# Patient Record
Sex: Female | Born: 1955 | Race: Black or African American | Hispanic: No | Marital: Married | Smoking: Never smoker
Health system: Southern US, Community
[De-identification: ages and names within clinical notes are randomized; demographics above are authoritative.]

## PROBLEM LIST (undated history)

## (undated) DIAGNOSIS — J45909 Unspecified asthma, uncomplicated: Secondary | ICD-10-CM

## (undated) DIAGNOSIS — R0600 Dyspnea, unspecified: Secondary | ICD-10-CM

## (undated) DIAGNOSIS — M419 Scoliosis, unspecified: Secondary | ICD-10-CM

## (undated) DIAGNOSIS — D860 Sarcoidosis of lung: Secondary | ICD-10-CM

## (undated) DIAGNOSIS — M5030 Other cervical disc degeneration, unspecified cervical region: Secondary | ICD-10-CM

## (undated) DIAGNOSIS — M5020 Other cervical disc displacement, unspecified cervical region: Secondary | ICD-10-CM

---

## 2013-06-15 ENCOUNTER — Ambulatory Visit: Payer: Self-pay | Admitting: Physician Assistant

## 2013-10-10 ENCOUNTER — Ambulatory Visit: Payer: Self-pay | Admitting: Physician Assistant

## 2014-06-06 ENCOUNTER — Emergency Department: Payer: Self-pay | Admitting: Internal Medicine

## 2016-10-24 IMAGING — CT CT HEAD WITHOUT CONTRAST
2 of 4 series · 11 of 37 positions shown, 13 images · non-contrast
Comparison: None.

CLINICAL DATA: Fall down steps this morning, left side head pain,
neck pain

EXAM:
CT HEAD WITHOUT CONTRAST
CT CERVICAL SPINE WITHOUT CONTRAST
TECHNIQUE: Multidetector CT imaging of the head and cervical spine was
performed following the standard protocol without intravenous
contrast. Multiplanar CT image reconstructions of the cervical spine
were also generated.

[Series 8: sag bone · sagittal · 0.19mm/px · 3 of 189 slices shown]
[im 63/189  brain]
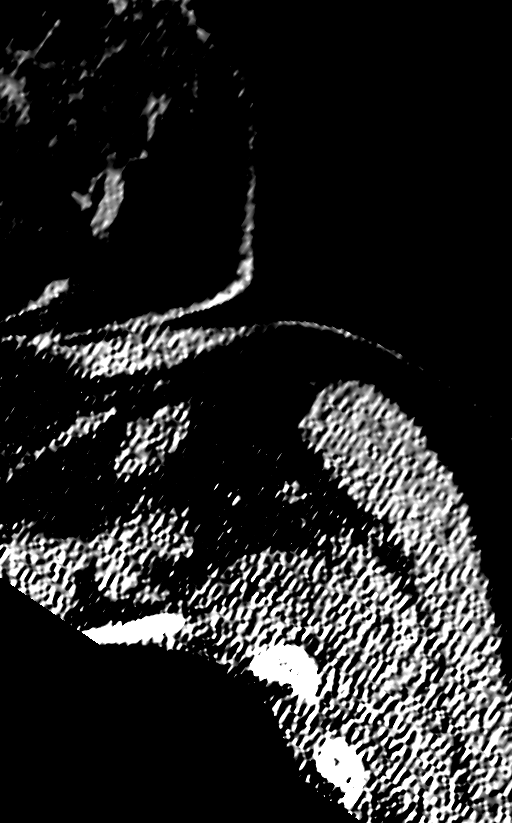
[im 95/189  brain]
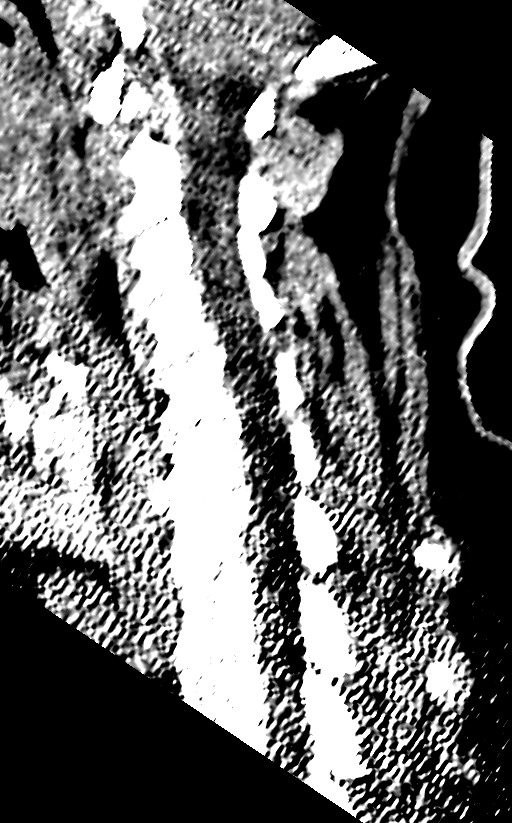
[im 126/189  brain]
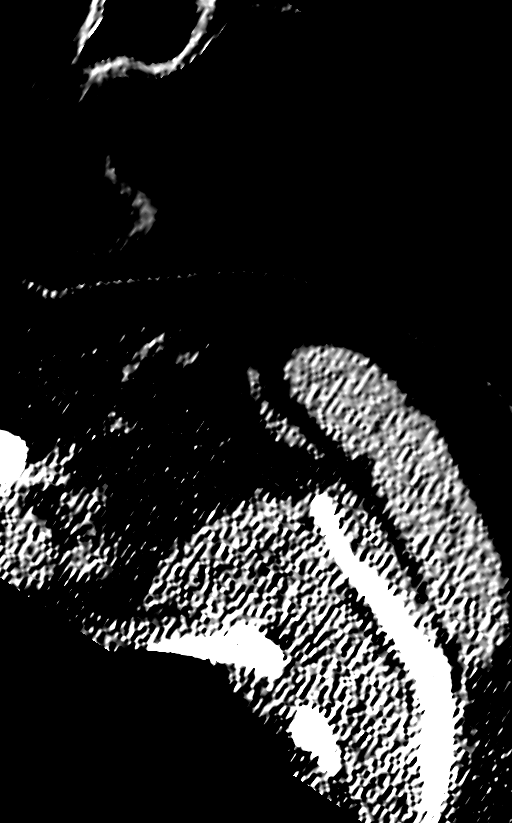

[Series 11: orthogonal axials · axial · 0.21mm/px · z∈[-310,-194]mm · 8 of 85 slices shown, 10 images]
[im 7/85  brain]
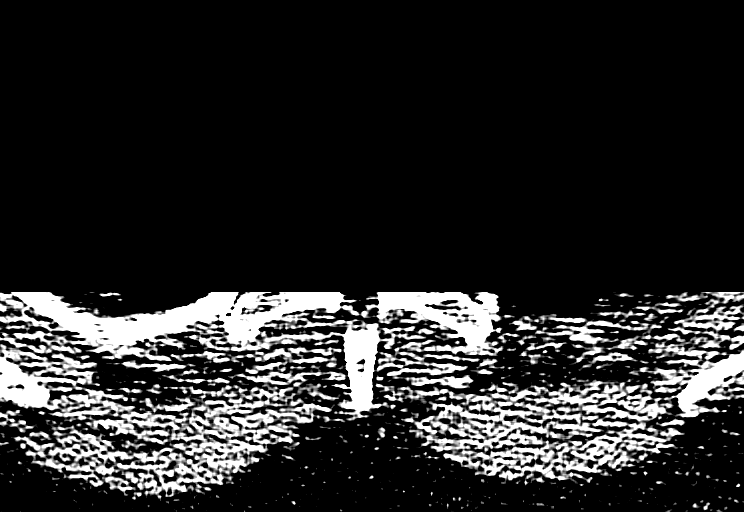
[im 7/85  bone]
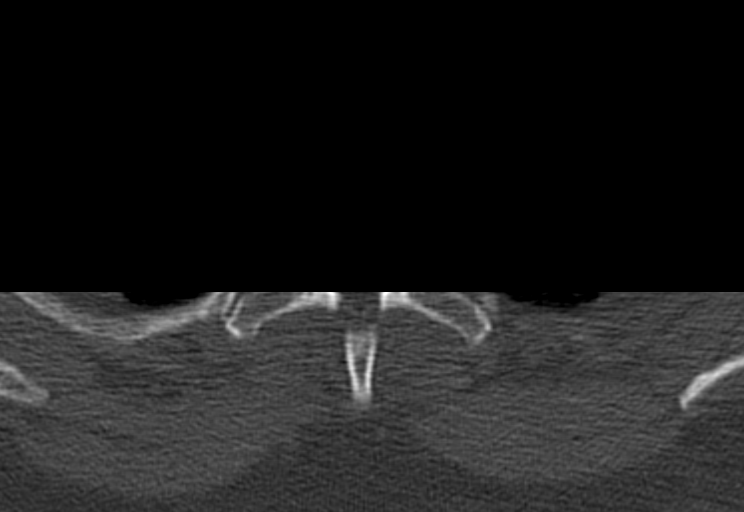
[im 20/85  brain]
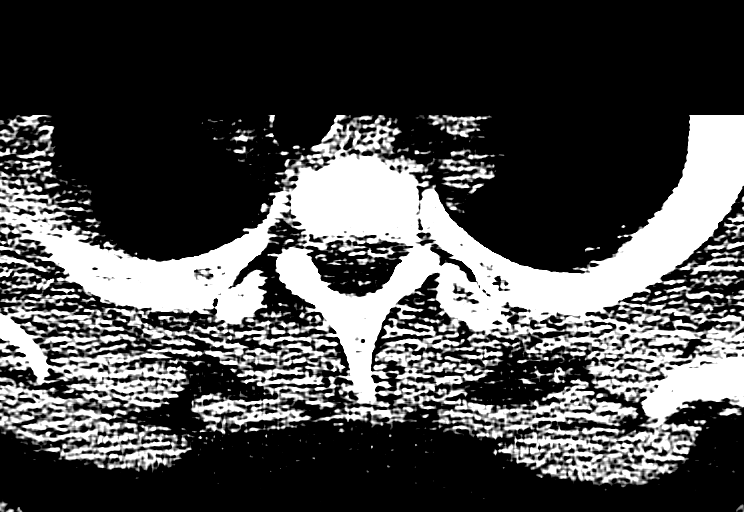
[im 26/85  brain]
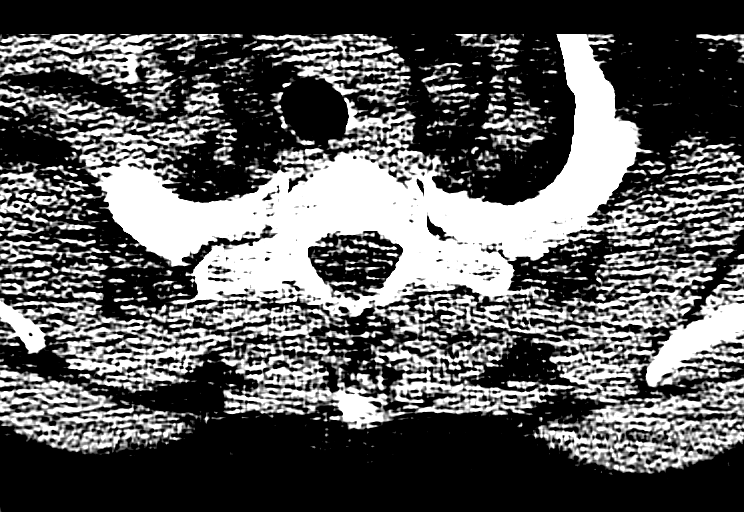
[im 39/85  brain]
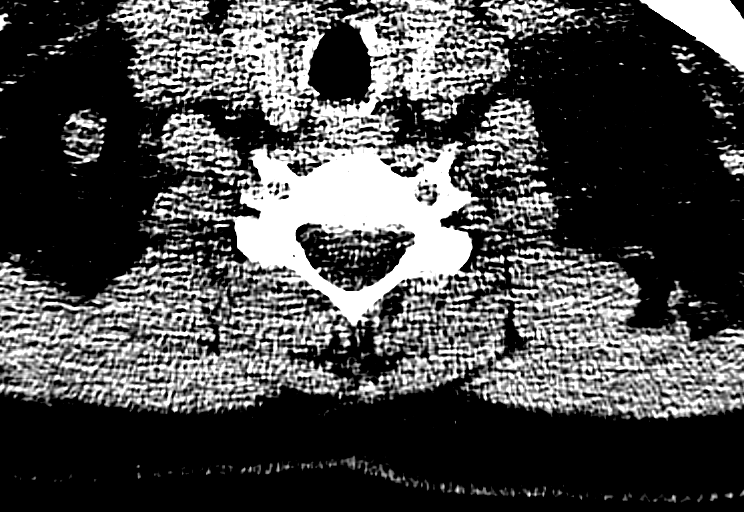
[im 46/85  brain]
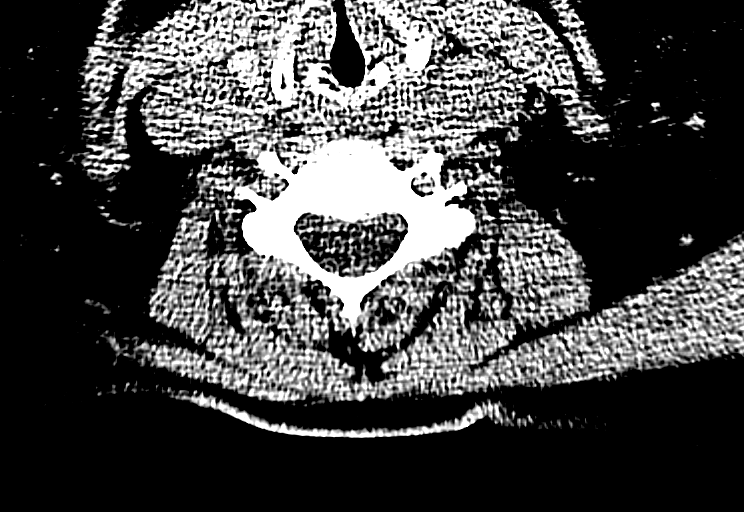
[im 46/85  bone]
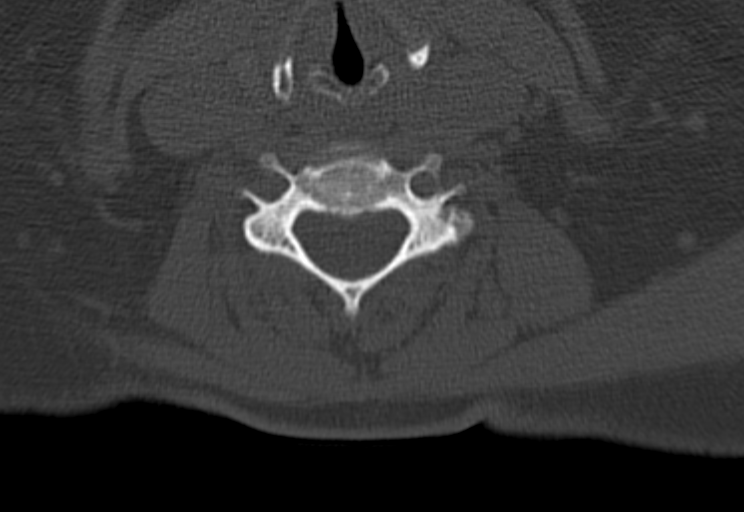
[im 59/85  brain]
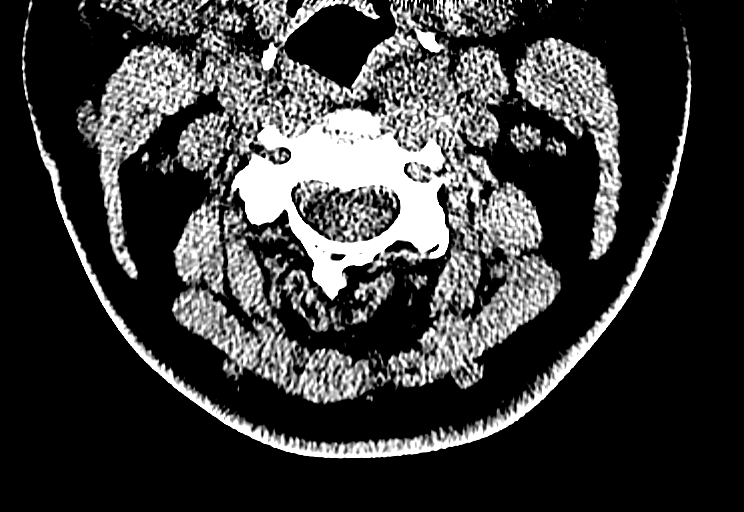
[im 65/85  brain]
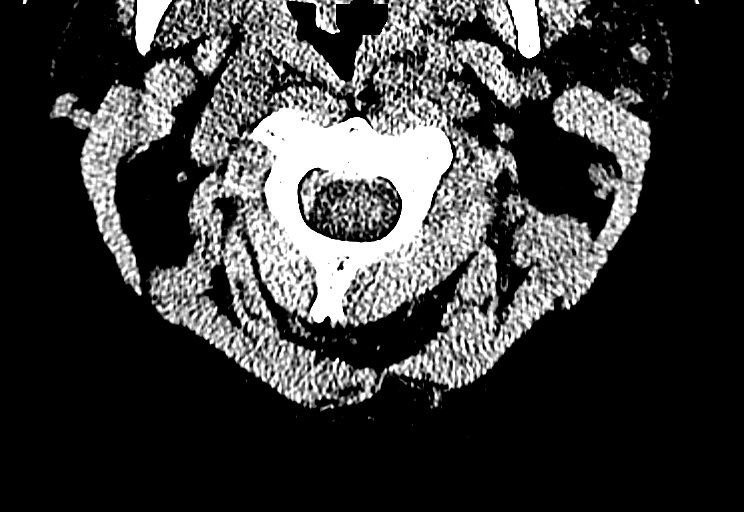
[im 78/85  brain]
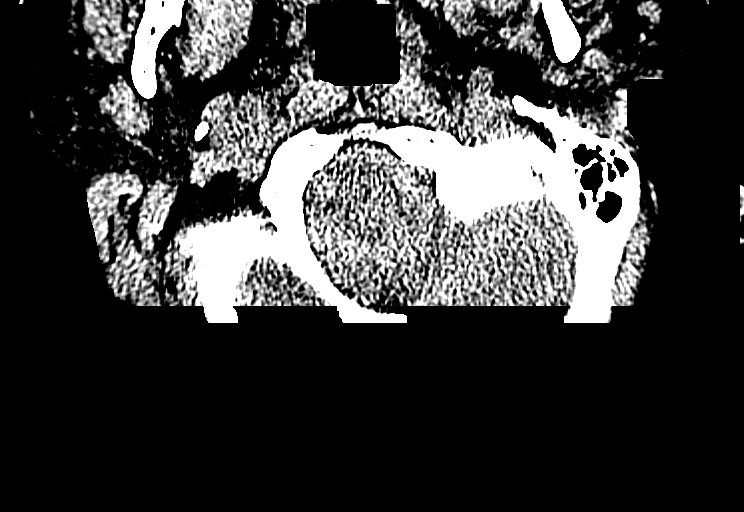

[11 of 37 positions shown; findings below may reference images not displayed]

FINDINGS: CT HEAD FINDINGS

No skull fracture is noted. Paranasal sinuses and mastoid air cells
are unremarkable.

No intracranial hemorrhage, mass effect or midline shift.

No acute cortical infarction. No mass lesion is noted on this
unenhanced scan. Bilateral basal ganglia calcifications are noted.

No hydrocephalus.  No intra or extra-axial fluid collection.

CT CERVICAL SPINE FINDINGS

Axial images of the cervical spine shows no acute fracture or
subluxation. Mild degenerative changes C1-C2 articulation. Mild
reversal of cervical lordosis. Mild anterior spurring at lower
endplate of C5 and C6 vertebral body. Minimal disc space flattening
at C5-C6 level. Mild disc space flattening with anterior spurring at
C6-C7 level. No prevertebral soft tissue swelling. Cervical airway
is patent.

There is no pneumothorax in visualized lung apices.
IMPRESSION: 1. No acute intracranial abnormality.
2. No cervical spine acute fracture or subluxation. Mild
degenerative changes as described above.

## 2017-07-23 NOTE — H&P (Signed)
  Chief Complaint:     [name] is a 62 y.o. female here for scheduled surgery.   HPI:  Pt presents for a preoperative visit to schedule a D&C, hysteroscopy, and polypectomy.  She has a hx of: postmenopausal bleeding and cervical polyp  Workup has included:  EMBx: disordered proliferative phase endometrium  Polypectomy in office:  Part A: CERVIX, LEFT SIDE, POLYP:  BENIGN ENDOCERVICAL POLYP.  Part B: CERVIX, RIGHT SIDE, POLYP:  BENIGN ENDOMETRIAL POLYP.    Past Medical History:  has no past medical history on file.  Past Surgical History:  has no past surgical history on file. Family History: family history is not on file. Social History:   OB/GYN History:  OB History   None     Allergies: has no allergies on file. Medications: No current facility-administered medications for this encounter.  No current outpatient medications on file.  Review of Systems: No SOB, no palpitations or chest pain, no new lower extremity edema, no nausea or vomiting or bowel or bladder complaints. See HPI for gyn specific ROS.   Exam:   BP: 140/89   Body mass index is 35.81 kg/m.  General: Patient is well-groomed, well-nourished, appears stated age in no acute distress   HEENT: head is atraumatic and normocephalic, trachea is midline, neck is supple with no palpable nodules   CV: Regular rhythm and normal heart rate, no murmur   Pulm: Clear to auscultation throughout lung fields with no wheezing, crackles, or rhonchi. No increased work of breathing  Abdomen: soft , no mass, non-tender, no rebound tenderness, no hepatomegaly  Pelvic: Deferred  Impression:   There were no encounter diagnoses.    Plan:   -  Preoperative visit: D&C hysteroscopy, polypectomy. Consents signed today. Risks of surgery were discussed with the patient including but not limited to: bleeding which may require transfusion; infection which may require antibiotics; injury to uterus or surrounding  organs; intrauterine scarring which may impair future fertility; need for additional procedures including laparotomy or laparoscopy; and other postoperative/anesthesia complications. Written informed consent was obtained.  This is a scheduled same-day surgery. She will have a postop visit in 2 weeks to review operative findings and pathology.  Christeen DouglasBethany Arletha Marschke, MD

## 2017-08-04 ENCOUNTER — Other Ambulatory Visit: Payer: Self-pay

## 2017-08-04 ENCOUNTER — Encounter
Admission: RE | Admit: 2017-08-04 | Discharge: 2017-08-04 | Disposition: A | Discharge status: Home / Self Care | Payer: BLUE CROSS/BLUE SHIELD | Source: Ambulatory Visit | Attending: Obstetrics and Gynecology | Admitting: Obstetrics and Gynecology

## 2017-08-04 DIAGNOSIS — Z01812 Encounter for preprocedural laboratory examination: Secondary | ICD-10-CM | POA: Diagnosis not present

## 2017-08-04 HISTORY — DX: Other cervical disc displacement, unspecified cervical region: M50.20

## 2017-08-04 HISTORY — DX: Other cervical disc degeneration, unspecified cervical region: M50.30

## 2017-08-04 HISTORY — DX: Unspecified asthma, uncomplicated: J45.909

## 2017-08-04 HISTORY — DX: Sarcoidosis of lung: D86.0

## 2017-08-04 HISTORY — DX: Scoliosis, unspecified: M41.9

## 2017-08-04 HISTORY — DX: Dyspnea, unspecified: R06.00

## 2017-08-04 LAB — BASIC METABOLIC PANEL
Anion gap: 9 (ref 5–15)
BUN: 12 mg/dL (ref 6–20)
CHLORIDE: 103 mmol/L (ref 101–111)
CO2: 25 mmol/L (ref 22–32)
Calcium: 8.7 mg/dL — ABNORMAL LOW (ref 8.9–10.3)
Creatinine, Ser: 0.74 mg/dL (ref 0.44–1.00)
GFR calc Af Amer: >60 mL/min (ref >60)
GFR calc non Af Amer: >60 mL/min (ref >60)
Glucose, Bld: 86 mg/dL (ref 65–99)
POTASSIUM: 4 mmol/L (ref 3.5–5.1)
Sodium: 137 mmol/L (ref 135–145)

## 2017-08-04 LAB — TYPE AND SCREEN
ABO/RH(D): B POS
Antibody Screen: NEGATIVE

## 2017-08-04 LAB — CBC
HEMATOCRIT: 40.7 % (ref 35.0–47.0)
Hemoglobin: 12.8 g/dL (ref 12.0–16.0)
MCH: 24.7 pg — ABNORMAL LOW (ref 26.0–34.0)
MCHC: 31.5 g/dL — AB (ref 32.0–36.0)
MCV: 78.5 fL — AB (ref 80.0–100.0)
Platelets: 285 10*3/uL (ref 150–440)
RBC: 5.19 MIL/uL (ref 3.80–5.20)
RDW: 15.8 % — AB (ref 11.5–14.5)
WBC: 12.6 10*3/uL — AB (ref 3.6–11.0)

## 2017-08-04 NOTE — Patient Instructions (Signed)
Your procedure is scheduled on: Monday 08/10/17 Report to [place]. To find out your arrival time please call [phone] between 1PM - 3PM on Friday 08/07/17.  Remember: Instructions that are not followed completely may result in serious medical risk, up to and including death, or upon the discretion of your surgeon and anesthesiologist your surgery may need to be rescheduled.     _X__ 1. Do not eat food after midnight the night before your procedure.                 No gum chewing or hard candies. You may drink clear liquids up to 2 hours                 before you are scheduled to arrive for your surgery- DO not drink clear                 liquids within 2 hours of the start of your surgery.                 Clear Liquids include:  water, apple juice without pulp, clear carbohydrate                 drink such as Clearfast or Gatorade, Black Coffee or Tea (Do not add                 anything to coffee or tea).  __X__2.  On the morning of surgery brush your teeth with toothpaste and water, you                 may rinse your mouth with mouthwash if you wish.  Do not swallow any              toothpaste of mouthwash.     _X__ 3.  No Alcohol for 24 hours before or after surgery.   _X__ 4.  Do Not Smoke or use e-cigarettes For 24 Hours Prior to Your Surgery.                 Do not use any chewable tobacco products for at least 6 hours prior to                 surgery.  ____  5.  Bring all medications with you on the day of surgery if instructed.   __X__  6.  Notify your doctor if there is any change in your medical condition      (cold, fever, infections).     Do not wear jewelry, make-up, hairpins, clips or nail polish. Do not wear lotions, powders, or perfumes.  Do not shave 48 hours prior to surgery. Men may shave face and neck. Do not bring valuables to the hospital.    [facility] is not responsible for any belongings or  valuables.  Contacts, dentures/partials or body piercings may not be worn into surgery. Bring a case for your contacts, glasses or hearing aids, a denture cup will be supplied. Leave your suitcase in the car. After surgery it may be brought to your room. For patients admitted to the hospital, discharge time is determined by your treatment team.   Patients discharged the day of surgery will not be allowed to drive home.   Please read over the following fact sheets that you were given:   MRSA Information  __X__ Take these medicines the morning of surgery with A SIP OF WATER:  1. Prednisone of no completed  2.   3.   4.  5.  6.  ____ Fleet Enema (as directed)   __X__ Use CHG Soap/SAGE wipes as directed  __x__ Use inhalers on the day of surgery and nebulizer prior to arrival  ____ Stop metformin/Janumet/Farxiga 2 days prior to surgery    ____ Take 1/2 of usual insulin dose the night before surgery. No insulin the morning          of surgery.   ____ Stop Blood Thinners Coumadin/Plavix/Xarelto/Pleta/Pradaxa/Eliquis/Effient/Aspirin  on   Or contact your Surgeon, Cardiologist or Medical Doctor regarding  ability to stop your blood thinners  __X__ Stop Anti-inflammatories 7 days before surgery such as Advil, Ibuprofen, Motrin,  BC or Goodies Powder, Naprosyn, Naproxen, Aleve, Aspirin    __X__ Stop all herbal supplements, fish oil or vitamin E until after surgery.    ____ Bring C-Pap to the hospital.

## 2017-08-05 ENCOUNTER — Inpatient Hospital Stay: Admission: RE | Admit: 2017-08-05 | Payer: Self-pay | Source: Ambulatory Visit

## 2017-08-10 ENCOUNTER — Other Ambulatory Visit: Payer: Self-pay

## 2017-08-10 ENCOUNTER — Ambulatory Visit: Payer: BLUE CROSS/BLUE SHIELD | Admitting: Anesthesiology

## 2017-08-10 ENCOUNTER — Encounter
Admission: RE | Disposition: A | Discharge status: Home / Self Care | Payer: Self-pay | Source: Ambulatory Visit | Attending: Obstetrics and Gynecology

## 2017-08-10 ENCOUNTER — Ambulatory Visit
Admission: RE | Admit: 2017-08-10 | Discharge: 2017-08-10 | Disposition: A | Discharge status: Home / Self Care | Payer: BLUE CROSS/BLUE SHIELD | Source: Ambulatory Visit | Attending: Obstetrics and Gynecology | Admitting: Obstetrics and Gynecology

## 2017-08-10 ENCOUNTER — Encounter: Payer: Self-pay | Admitting: *Deleted

## 2017-08-10 DIAGNOSIS — J45909 Unspecified asthma, uncomplicated: Secondary | ICD-10-CM | POA: Insufficient documentation

## 2017-08-10 DIAGNOSIS — N950 Postmenopausal bleeding: Secondary | ICD-10-CM | POA: Insufficient documentation

## 2017-08-10 DIAGNOSIS — N840 Polyp of corpus uteri: Secondary | ICD-10-CM | POA: Diagnosis not present

## 2017-08-10 DIAGNOSIS — M419 Scoliosis, unspecified: Secondary | ICD-10-CM | POA: Diagnosis not present

## 2017-08-10 DIAGNOSIS — D860 Sarcoidosis of lung: Secondary | ICD-10-CM | POA: Diagnosis not present

## 2017-08-10 HISTORY — PX: HYSTEROSCOPY WITH D & C: SHX1775

## 2017-08-10 LAB — ABO/RH: ABO/RH(D): B POS

## 2017-08-10 SURGERY — DILATATION AND CURETTAGE /HYSTEROSCOPY
Anesthesia: General | Wound class: Clean Contaminated

## 2017-08-10 MED ORDER — LACTATED RINGERS IV SOLN
INTRAVENOUS | Status: DC
  Administered 2017-08-10: 13:00:00 via INTRAVENOUS

## 2017-08-10 MED ORDER — IPRATROPIUM-ALBUTEROL 0.5-2.5 (3) MG/3ML IN SOLN: 3.0000 mL | Freq: Four times a day (QID) | RESPIRATORY_TRACT | Status: DC

## 2017-08-10 MED ORDER — FENTANYL CITRATE (PF) 100 MCG/2ML IJ SOLN: 25.0000 ug | INTRAMUSCULAR | Status: DC | PRN

## 2017-08-10 MED ORDER — FAMOTIDINE 20 MG PO TABS
20.0000 mg | ORAL_TABLET | Freq: Once | ORAL | Status: AC
  Administered 2017-08-10: 20 mg via ORAL

## 2017-08-10 MED ORDER — MEPERIDINE HCL 50 MG/ML IJ SOLN: 6.2500 mg | INTRAMUSCULAR | Status: DC | PRN

## 2017-08-10 MED ORDER — FAMOTIDINE 20 MG PO TABS
ORAL_TABLET | ORAL | Status: AC
  Administered 2017-08-10: 20 mg via ORAL
  Filled 2017-08-10: qty 1

## 2017-08-10 MED ORDER — HYDROCODONE-ACETAMINOPHEN 7.5-325 MG PO TABS
1.0000 | ORAL_TABLET | Freq: Once | ORAL | Status: DC | PRN
  Filled 2017-08-10: qty 1

## 2017-08-10 MED ORDER — PROPOFOL 10 MG/ML IV BOLUS
INTRAVENOUS | Status: AC
  Filled 2017-08-10: qty 40

## 2017-08-10 MED ORDER — PHENYLEPHRINE HCL 10 MG/ML IJ SOLN
INTRAMUSCULAR | Status: DC | PRN
  Administered 2017-08-10: 100 ug via INTRAVENOUS

## 2017-08-10 MED ORDER — PROPOFOL 10 MG/ML IV BOLUS
INTRAVENOUS | Status: DC | PRN
  Administered 2017-08-10: 200 mg via INTRAVENOUS

## 2017-08-10 MED ORDER — FENTANYL CITRATE (PF) 100 MCG/2ML IJ SOLN
INTRAMUSCULAR | Status: AC
  Filled 2017-08-10: qty 2

## 2017-08-10 MED ORDER — LIDOCAINE HCL (PF) 2 % IJ SOLN
INTRAMUSCULAR | Status: AC
  Filled 2017-08-10: qty 10

## 2017-08-10 MED ORDER — MIDAZOLAM HCL 2 MG/2ML IJ SOLN
INTRAMUSCULAR | Status: DC | PRN
  Administered 2017-08-10: 2 mg via INTRAVENOUS

## 2017-08-10 MED ORDER — GLYCOPYRROLATE 0.2 MG/ML IJ SOLN
INTRAMUSCULAR | Status: AC
  Filled 2017-08-10: qty 1

## 2017-08-10 MED ORDER — ONDANSETRON HCL 4 MG/2ML IJ SOLN
INTRAMUSCULAR | Status: AC
  Filled 2017-08-10: qty 2

## 2017-08-10 MED ORDER — IPRATROPIUM-ALBUTEROL 0.5-2.5 (3) MG/3ML IN SOLN
3.0000 mL | Freq: Once | RESPIRATORY_TRACT | Status: AC
  Administered 2017-08-10: 3 mL via RESPIRATORY_TRACT

## 2017-08-10 MED ORDER — MIDAZOLAM HCL 2 MG/2ML IJ SOLN
INTRAMUSCULAR | Status: AC
  Filled 2017-08-10: qty 2

## 2017-08-10 MED ORDER — IPRATROPIUM-ALBUTEROL 0.5-2.5 (3) MG/3ML IN SOLN
RESPIRATORY_TRACT | Status: AC
  Administered 2017-08-10: 3 mL via RESPIRATORY_TRACT
  Filled 2017-08-10: qty 3

## 2017-08-10 MED ORDER — ONDANSETRON HCL 4 MG/2ML IJ SOLN
INTRAMUSCULAR | Status: DC | PRN
  Administered 2017-08-10: 4 mg via INTRAVENOUS

## 2017-08-10 MED ORDER — GLYCOPYRROLATE 0.2 MG/ML IJ SOLN
INTRAMUSCULAR | Status: DC | PRN
  Administered 2017-08-10: 0.2 mg via INTRAVENOUS

## 2017-08-10 MED ORDER — PROMETHAZINE HCL 25 MG/ML IJ SOLN: 6.2500 mg | INTRAMUSCULAR | Status: DC | PRN

## 2017-08-10 MED ORDER — FENTANYL CITRATE (PF) 100 MCG/2ML IJ SOLN
INTRAMUSCULAR | Status: DC | PRN
  Administered 2017-08-10 (×4): 25 ug via INTRAVENOUS

## 2017-08-10 MED ORDER — LIDOCAINE HCL (CARDIAC) PF 100 MG/5ML IV SOSY
PREFILLED_SYRINGE | INTRAVENOUS | Status: DC | PRN
  Administered 2017-08-10: 60 mg via INTRAVENOUS

## 2017-08-10 SURGICAL SUPPLY — 17 items
BAG INFUSER PRESSURE 100CC (MISCELLANEOUS) ×3 IMPLANT
CANISTER SUCT 3000ML PPV (MISCELLANEOUS) ×3 IMPLANT
CATH ROBINSON RED A/P 16FR (CATHETERS) ×3 IMPLANT
ELECT REM PT RETURN 9FT ADLT (ELECTROSURGICAL) ×3
ELECTRODE REM PT RTRN 9FT ADLT (ELECTROSURGICAL) ×1 IMPLANT
GLOVE BIO SURGEON STRL SZ7 (GLOVE) ×3 IMPLANT
GLOVE INDICATOR 7.5 STRL GRN (GLOVE) ×3 IMPLANT
GOWN STRL REUS W/ TWL LRG LVL3 (GOWN DISPOSABLE) ×2 IMPLANT
GOWN STRL REUS W/TWL LRG LVL3 (GOWN DISPOSABLE) ×4
IV LACTATED RINGERS 1000ML (IV SOLUTION) ×3 IMPLANT
KIT TURNOVER CYSTO (KITS) ×3 IMPLANT
PACK DNC HYST (MISCELLANEOUS) ×3 IMPLANT
PAD OB MATERNITY 4.3X12.25 (PERSONAL CARE ITEMS) ×3 IMPLANT
PAD PREP 24X41 OB/GYN DISP (PERSONAL CARE ITEMS) ×3 IMPLANT
TUBING CONNECTING 10 (TUBING) ×2 IMPLANT
TUBING CONNECTING 10' (TUBING) ×1
TUBING HYSTEROSCOPY DOLPHIN (MISCELLANEOUS) ×3 IMPLANT

## 2017-08-10 NOTE — Interval H&P Note (Signed)
History and Physical Interval Note:  08/10/2017 1:11 PM  Victoria Leon  has presented today for surgery, with the diagnosis of postmenopausal bleeding  The various methods of treatment have been discussed with the patient and family. After consideration of risks, benefits and other options for treatment, the patient has consented to  Procedure(s): DILATATION AND CURETTAGE /HYSTEROSCOPY (N/A) as a surgical intervention .  The patient's history has been reviewed, patient examined, no change in status, stable for surgery.  I have reviewed the patient's chart and labs.  Questions were answered to the patient's satisfaction.     Christeen Douglas

## 2017-08-10 NOTE — Anesthesia Preprocedure Evaluation (Addendum)
Anesthesia Evaluation  Patient identified by MRN, date of birth, ID band Patient awake    Reviewed: Allergy & Precautions, H&P , NPO status , reviewed documented beta blocker date and time   Airway Mallampati: III  TM Distance: >3 FB Neck ROM: full    Dental  (+) Upper Dentures, Chipped   Pulmonary shortness of breath, asthma ,   Limited chest excursion Pulmonary exam normal breath sounds clear to auscultation + decreased breath sounds      Cardiovascular      Neuro/Psych    GI/Hepatic   Endo/Other    Renal/GU      Musculoskeletal  (+) Arthritis ,   Abdominal   Peds  Hematology   Anesthesia Other Findings Past Medical History: No date: Asthma No date: Bulging of cervical intervertebral disc No date: Dyspnea No date: Sarcoidosis of lung (HCC) No date: Scoliosis  Past Surgical History: No date: CESAREAN SECTION  BMI    Body Mass Index:  36.84 kg/m      Reproductive/Obstetrics                             Anesthesia Physical Anesthesia Plan  ASA: III  Anesthesia Plan: General   Post-op Pain Management:    Induction:   PONV Risk Score and Plan: 4 or greater and Ondansetron, Midazolam and Metaclopromide  Airway Management Planned:   Additional Equipment:   Intra-op Plan:   Post-operative Plan:   Informed Consent: I have reviewed the patients History and Physical, chart, labs and discussed the procedure including the risks, benefits and alternatives for the proposed anesthesia with the patient or authorized representative who has indicated his/her understanding and acceptance.   Dental Advisory Given  Plan Discussed with: CRNA  Anesthesia Plan Comments:         Anesthesia Quick Evaluation

## 2017-08-10 NOTE — Discharge Instructions (Addendum)
Discharge instructions after a hysteroscopy with dilation and curettage ? ?Signs and Symptoms to Report ? ?Call our office at [phone] if you have any of the following:  ? ? Fever over 100.4 degrees or higher ? Severe stomach pain not relieved with pain medications ? Bright red bleeding that?s heavier than a period that does not slow with rest after the first 24 hours ? To go the bathroom a lot (frequency), you can?t hold your urine (urgency), or it hurts when you empty your bladder (urinate) ? Chest pain ? Shortness of breath ? Pain in the calves of your legs ? Severe nausea and vomiting not relieved with anti-nausea medications ? Any concerns ? ?What You Can Expect after Surgery ? You may see some pink tinged, bloody fluid. This is normal. You may also have cramping for several days.  ? ?Activities after Your Discharge ?Follow these guidelines to help speed your recovery at home: ? Don?t drive if you are in pain or taking narcotic pain medicine. You may drive when you can safely slam on the brakes, turn the wheel forcefully, and rotate your torso comfortably. This is typically 4-7 days. Practice in a parking lot or side street prior to attempting to drive regularly.  ? Ask others to help with household chores for 4 weeks. ? Don?t do strenuous activities, exercises, or sports like vacuuming, tennis, squash, etc. until your doctor says it is safe to do so. ? Walk as you feel able. Rest often since it may take a week or two for your energy level to return to normal.  ? You may climb stairs ? Avoid constipation: ?  -Eat fruits, vegetables, and whole grains. Eat small meals as your appetite will take time to return to normal. ?  -Drink 6 to 8 glasses of water each day unless your doctor has told you to limit your fluids. ?  -Use a laxative or stool softener as needed if constipation becomes a problem. You may take Miralax, metamucil, Citrucil, Colace, Senekot, FiberCon, etc. If this does not relieve the  constipation, try two tablespoons of Milk Of Magnesia every 8 hours until your bowels move.  ? You may shower.  ? Do not get in a hot tub, swimming pool, etc. until your doctor agrees. ? Do not douche, use tampons, or have sex until your doctor says it is okay, usually about 2 weeks. ? Take your pain medicine when you need it. The medicine may not work as well if the pain is bad. ? ?Take the medicines you were taking before surgery. Other medications you might need are pain medications (ibuprofen), medications for constipation (Colace) and nausea medications (Zofran).  ? ?  ?

## 2017-08-10 NOTE — Transfer of Care (Signed)
Immediate Anesthesia Transfer of Care Note  Patient: Victoria Leon  Procedure(s) Performed: DILATATION AND CURETTAGE /HYSTEROSCOPY WITH POLYPECTOMY (N/A )  Patient Location: PACU  Anesthesia Type:General  Level of Consciousness: awake, alert  and oriented  Airway & Oxygen Therapy: Patient Spontanous Breathing and Patient connected to face mask oxygen  Post-op Assessment: Report given to RN and Post -op Vital signs reviewed and stable  Post vital signs: Reviewed and stable  Last Vitals:  Vitals Value Taken Time  BP 153/78 08/10/2017  2:13 PM  Temp    Pulse 74 08/10/2017  2:14 PM  Resp 21 08/10/2017  2:14 PM  SpO2 100 % 08/10/2017  2:14 PM  Vitals shown include unvalidated device data.  Last Pain:  Vitals:   08/10/17 1206  TempSrc: Tympanic  PainSc: 0-No pain         Complications: No apparent anesthesia complications

## 2017-08-10 NOTE — Anesthesia Post-op Follow-up Note (Signed)
Anesthesia QCDR form completed.        

## 2017-08-10 NOTE — Op Note (Signed)
Operative Report Hysteroscopy with Dilation and Curettage   Indications: Postmenopausal bleeding   Pre-operative Diagnosis:  - endometrial polyp   Post-operative Diagnosis: same.  Procedure: 1. Exam under anesthesia 2. Fractional D&C 3. Hysteroscopy 4. Myosure polypectomy  Surgeon: Christeen Douglas, MD  Assistant(s):  None  Anesthesia: General LMA anesthesia  Anesthesiologist: Christia Reading, MD Anesthesiologist: Christia Reading, MD CRNA: Omer Jack, CRNA Student Nurse Anesthetist: Larina Bras, RN  Estimated Blood Loss:  minimal         Intraoperative medications: n/a         Total IV Fluids:  Urine Output:  Total Fluid Deficit:  90 mL          Specimens: Endocervical curettings, endometrial curettings, endometrial polyp         Complications:  None; patient tolerated the procedure well.         Disposition: PACU - hemodynamically stable.         Condition: stable  Findings: Retroverted uterus measuring 8 cm by sound; normal cervix, vagina, perineum. 3 polyps noted in the endometrial canal, both tubal ostia noted.   Indication for procedure/Consents: 62 y.o.  here for scheduled surgery for the aforementioned diagnoses.  Risks of surgery were discussed with the patient including but not limited to: bleeding which may require transfusion; infection which may require antibiotics; injury to uterus or surrounding organs; intrauterine scarring which may impair future fertility; need for additional procedures including laparotomy or laparoscopy; and other postoperative/anesthesia complications. Written informed consent was obtained.    Procedure Details:    D&C/ Myosure  The patient was taken to the operating room where anesthesia was administered and was found to be adequate. After a formal and adequate timeout was performed, she was placed in the dorsal lithotomy position and examined with the above findings. She was then prepped and draped in the  sterile manner. Her bladder was catheterized for an estimated amount of clear, yellow urine. A weighed speculum was then placed in the patient's vagina and a single tooth tenaculum was applied to the anterior lip of the cervix.  Her cervix was serially dilated to 15 [place] using Hanks dilators. An ECC was performed. The hysteroscope was introduced under direct observation  Using lactated ringers as a distention medium to reveal the above findings. The uterine cavity was carefully examined, both ostia were recognized, and diffusely proliferative endometrium with the polyps above were noted.   This was resected using the Myosure device.  After further careful visualization of the uterine cavity, the hysteroscope was removed under direct visualization.  A sharp curettage was then performed until there was a gritty texture in all four quadrants. The tenaculum was removed from the anterior lip of the cervix and the vaginal speculum was removed for good hemostasis.   The patient tolerated the procedure well and was taken to the recovery area awake and in stable condition. She received iv acetaminophen and Toradol prior to leaving the OR.  The patient will be discharged to home as per PACU criteria. Routine postoperative instructions given. She was prescribed Ibuprofen and Colace. She will follow up in the clinic in two weeks for postoperative evaluation.

## 2017-08-10 NOTE — Anesthesia Procedure Notes (Signed)
Procedure Name: LMA Insertion Date/Time: 08/10/2017 1:22 PM Performed by: Christia Reading, MD Pre-anesthesia Checklist: Patient identified, Emergency Drugs available, Suction available, Patient being monitored and Timeout performed Patient Re-evaluated:Patient Re-evaluated prior to induction Oxygen Delivery Method: Circle system utilized Preoxygenation: Pre-oxygenation with 100% oxygen Induction Type: IV induction Ventilation: Mask ventilation without difficulty and Oral airway inserted - appropriate to patient size LMA: LMA inserted LMA Size: 4.0 Number of attempts: 1 Airway Equipment and Method: Patient positioned with wedge pillow

## 2017-08-11 ENCOUNTER — Encounter: Payer: Self-pay | Admitting: Obstetrics and Gynecology

## 2017-08-12 LAB — SURGICAL PATHOLOGY

## 2017-08-12 NOTE — Anesthesia Postprocedure Evaluation (Signed)
Anesthesia Post Note  Patient: Victoria Leon  Procedure(s) Performed: DILATATION AND CURETTAGE /HYSTEROSCOPY WITH POLYPECTOMY (N/A )  Patient location during evaluation: PACU Anesthesia Type: General Level of consciousness: awake and alert Pain management: pain level controlled Vital Signs Assessment: post-procedure vital signs reviewed and stable Respiratory status: spontaneous breathing, nonlabored ventilation and respiratory function stable Cardiovascular status: blood pressure returned to baseline and stable Postop Assessment: no apparent nausea or vomiting Anesthetic complications: no     Last Vitals:  Vitals:   08/10/17 1454 08/10/17 1521  BP: (!) 171/79 (!) 163/85  Pulse: 70 (!) 58  Resp: 16 16  Temp: (!) 36.4 C 36.6 C  SpO2: 100% 99%    Last Pain:  Vitals:   08/10/17 1521  TempSrc: Temporal  PainSc: 0-No pain                 Christia Reading
